# Patient Record
Sex: Male | Born: 1975 | Race: Black or African American | Hispanic: No | Marital: Single | Smoking: Current every day smoker
Health system: Southern US, Community
[De-identification: ages and names within clinical notes are randomized; demographics above are authoritative.]

---

## 2004-12-05 ENCOUNTER — Emergency Department: Payer: Self-pay | Admitting: Emergency Medicine

## 2016-11-04 ENCOUNTER — Emergency Department
Admission: EM | Admit: 2016-11-04 | Discharge: 2016-11-04 | Disposition: A | Discharge status: Home / Self Care | Payer: No Typology Code available for payment source | Attending: Emergency Medicine | Admitting: Emergency Medicine

## 2016-11-04 ENCOUNTER — Emergency Department: Payer: No Typology Code available for payment source

## 2016-11-04 ENCOUNTER — Encounter: Payer: Self-pay | Admitting: Emergency Medicine

## 2016-11-04 DIAGNOSIS — M7918 Myalgia, other site: Secondary | ICD-10-CM

## 2016-11-04 DIAGNOSIS — S161XXA Strain of muscle, fascia and tendon at neck level, initial encounter: Secondary | ICD-10-CM | POA: Diagnosis not present

## 2016-11-04 DIAGNOSIS — Y92410 Unspecified street and highway as the place of occurrence of the external cause: Secondary | ICD-10-CM | POA: Insufficient documentation

## 2016-11-04 DIAGNOSIS — Y999 Unspecified external cause status: Secondary | ICD-10-CM | POA: Diagnosis not present

## 2016-11-04 DIAGNOSIS — S0502XA Injury of conjunctiva and corneal abrasion without foreign body, left eye, initial encounter: Secondary | ICD-10-CM | POA: Diagnosis not present

## 2016-11-04 DIAGNOSIS — F17290 Nicotine dependence, other tobacco product, uncomplicated: Secondary | ICD-10-CM | POA: Diagnosis not present

## 2016-11-04 DIAGNOSIS — S199XXA Unspecified injury of neck, initial encounter: Secondary | ICD-10-CM | POA: Diagnosis present

## 2016-11-04 DIAGNOSIS — Y9389 Activity, other specified: Secondary | ICD-10-CM | POA: Diagnosis not present

## 2016-11-04 MED ORDER — OXYCODONE-ACETAMINOPHEN 5-325 MG PO TABS
2.0000 | ORAL_TABLET | Freq: Once | ORAL | Status: AC
  Administered 2016-11-04: 2 via ORAL
  Filled 2016-11-04: qty 2

## 2016-11-04 MED ORDER — DIAZEPAM 5 MG PO TABS: 5.0000 mg | ORAL_TABLET | Freq: Three times a day (TID) | ORAL | 0 refills | Status: AC | PRN

## 2016-11-04 MED ORDER — IBUPROFEN 800 MG PO TABS: 800.0000 mg | ORAL_TABLET | Freq: Three times a day (TID) | ORAL | 0 refills | Status: AC | PRN

## 2016-11-04 MED ORDER — TETRACAINE HCL 0.5 % OP SOLN
2.0000 [drp] | Freq: Once | OPHTHALMIC | Status: AC
  Administered 2016-11-04: 2 [drp] via OPHTHALMIC
  Filled 2016-11-04: qty 2

## 2016-11-04 MED ORDER — FLUORESCEIN SODIUM 0.6 MG OP STRP
1.0000 | ORAL_STRIP | Freq: Once | OPHTHALMIC | Status: AC
  Administered 2016-11-04: 1 via OPHTHALMIC
  Filled 2016-11-04: qty 1
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
## 2018-01-23 IMAGING — CR DG KNEE COMPLETE 4+V*R*
4 series · 4 of 4 positions shown · non-contrast
Comparison: None.

CLINICAL DATA: Pain after trauma

EXAM:
RIGHT KNEE - COMPLETE 4+ VIEW

[knee ap]
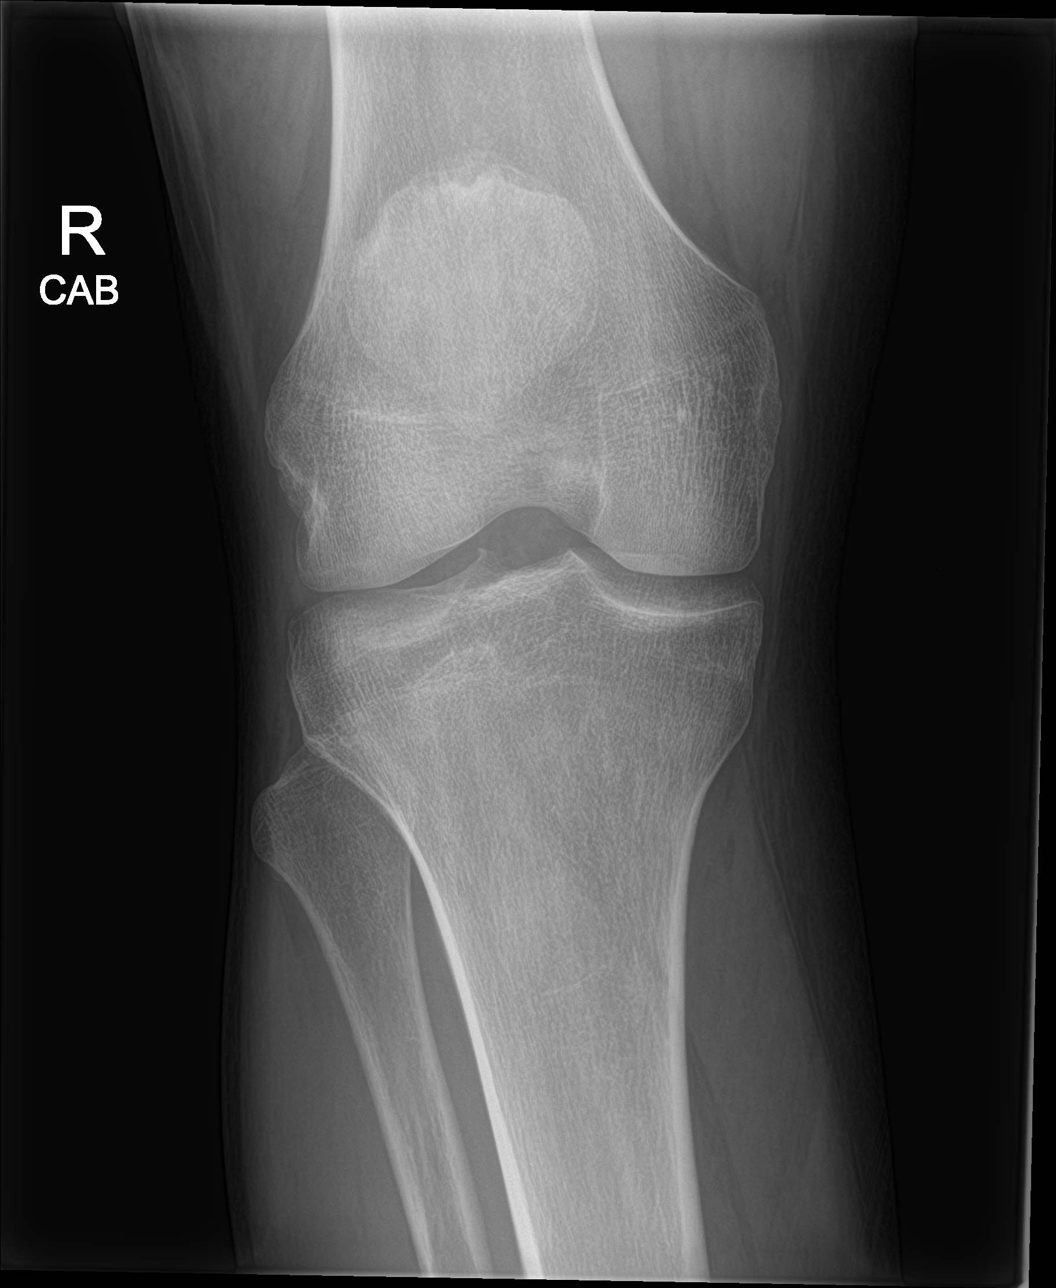

[knee obl (1 of 2)]
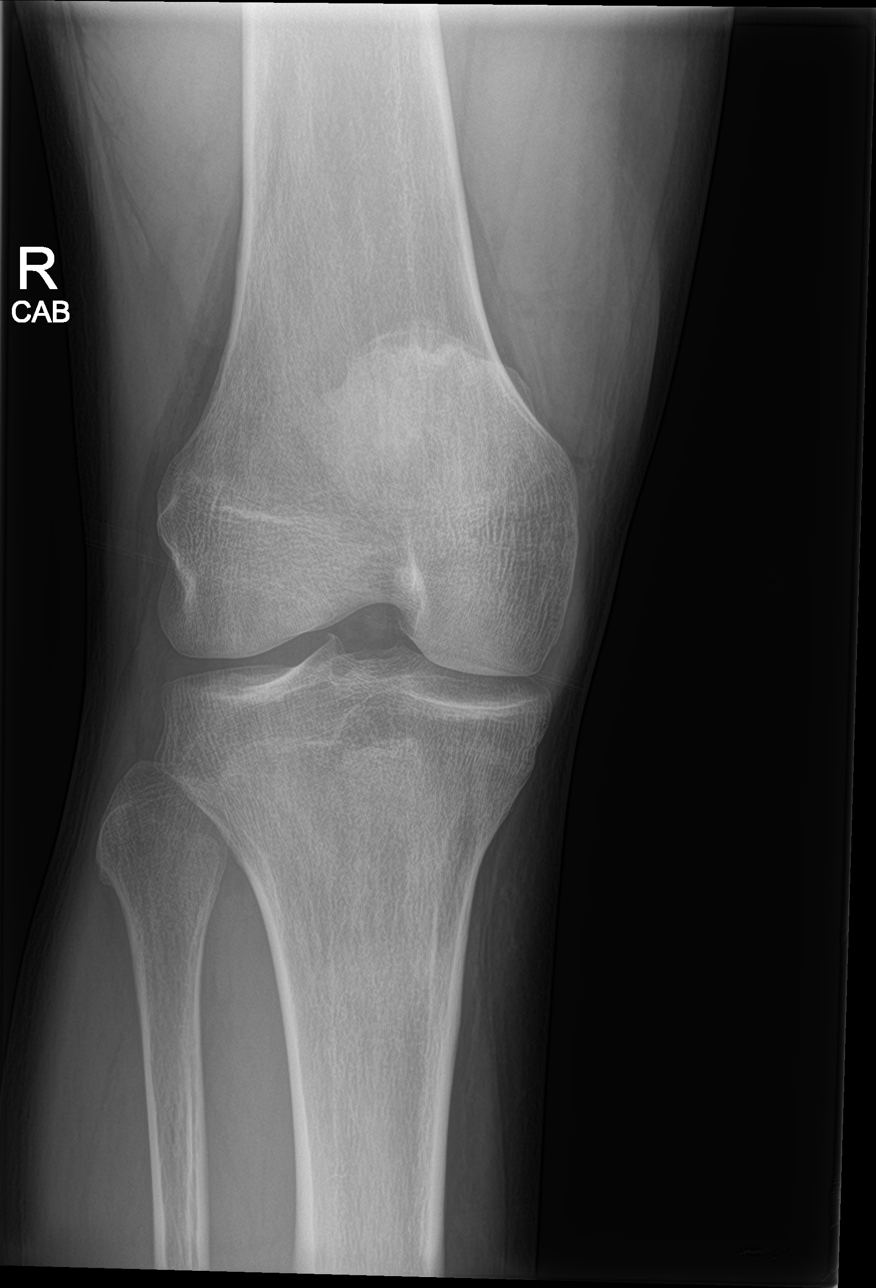

[knee obl (2 of 2)]
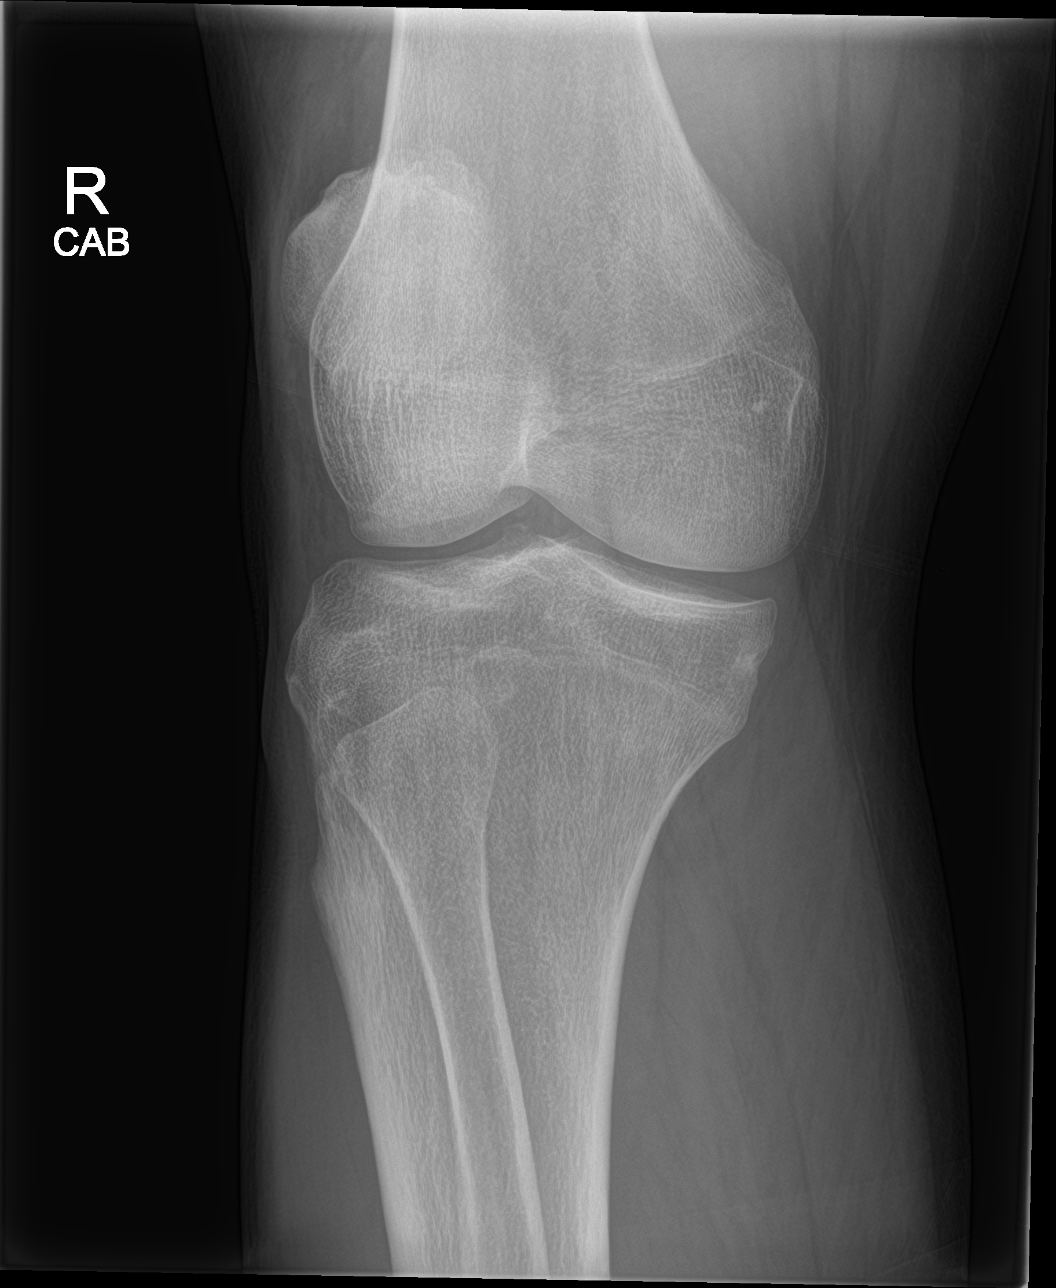

[knee lat]
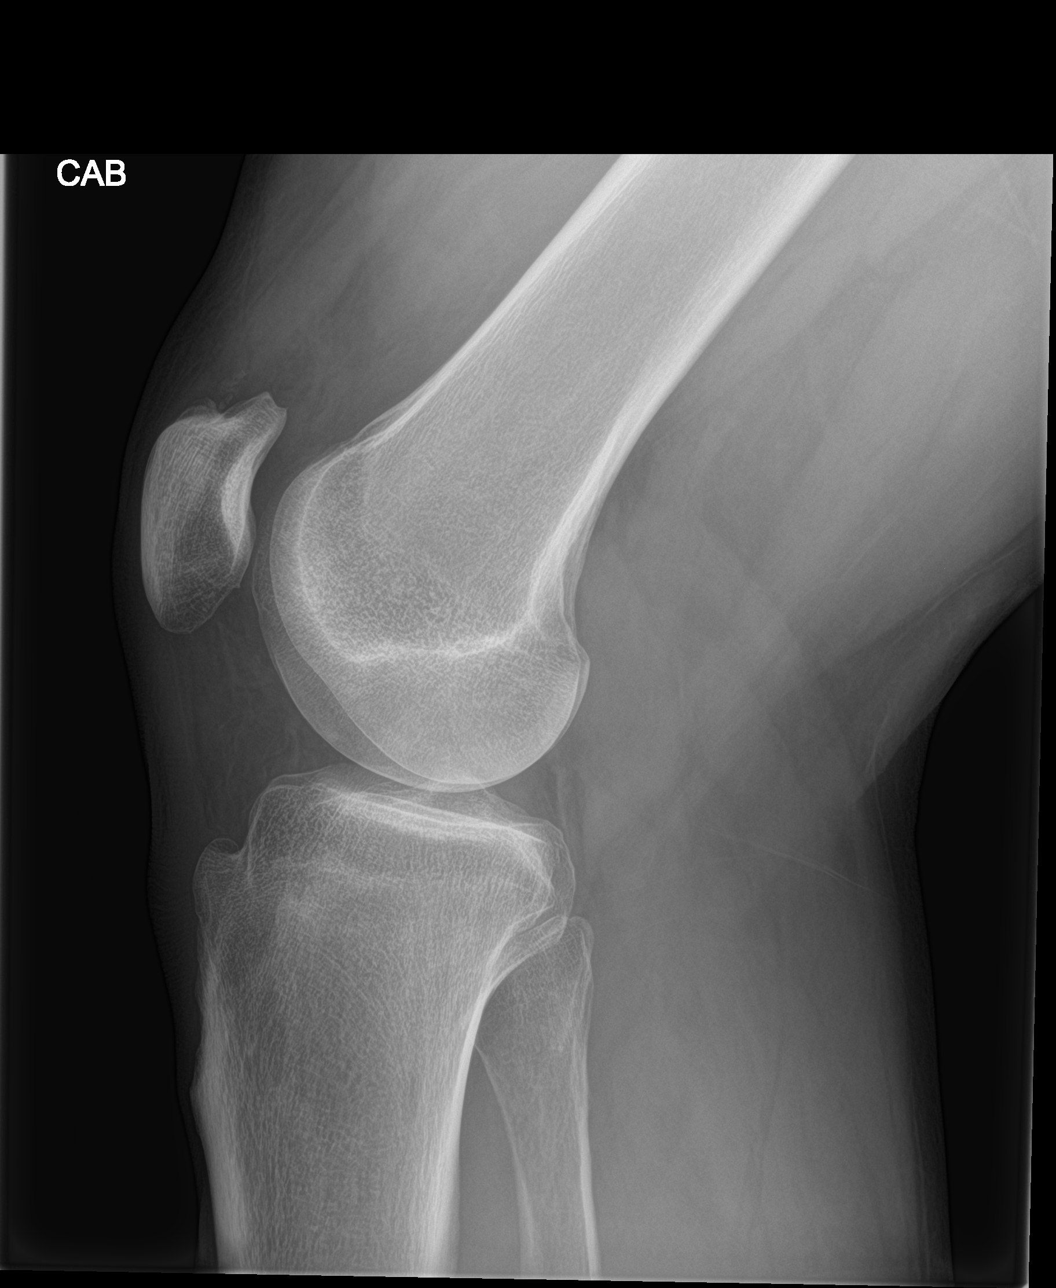

[4 of 4 positions shown; findings below may reference images not displayed]

FINDINGS: The visualized femur, tibia, and fibula demonstrate no evidence of
fracture. Mild irregularity along the superior aspect of the patella
with adjacent soft tissue calcifications are age indeterminate. The
patella however is in good position which would argue against a
significant tendinous injury. No significant joint effusion.
IMPRESSION: Mild irregularity of the superior patella with adjacent soft tissue
calcifications. This is age indeterminate but may not be acute.
Recommend clinical correlation for pain in this region. No other
abnormalities.

## 2018-01-23 IMAGING — CR DG THORACIC SPINE 2V
4 series · 4 of 4 positions shown · non-contrast
Comparison: None.

CLINICAL DATA: Pain after trauma

EXAM:
THORACIC SPINE 2 VIEWS

[t-spine ap (1 of 2)]
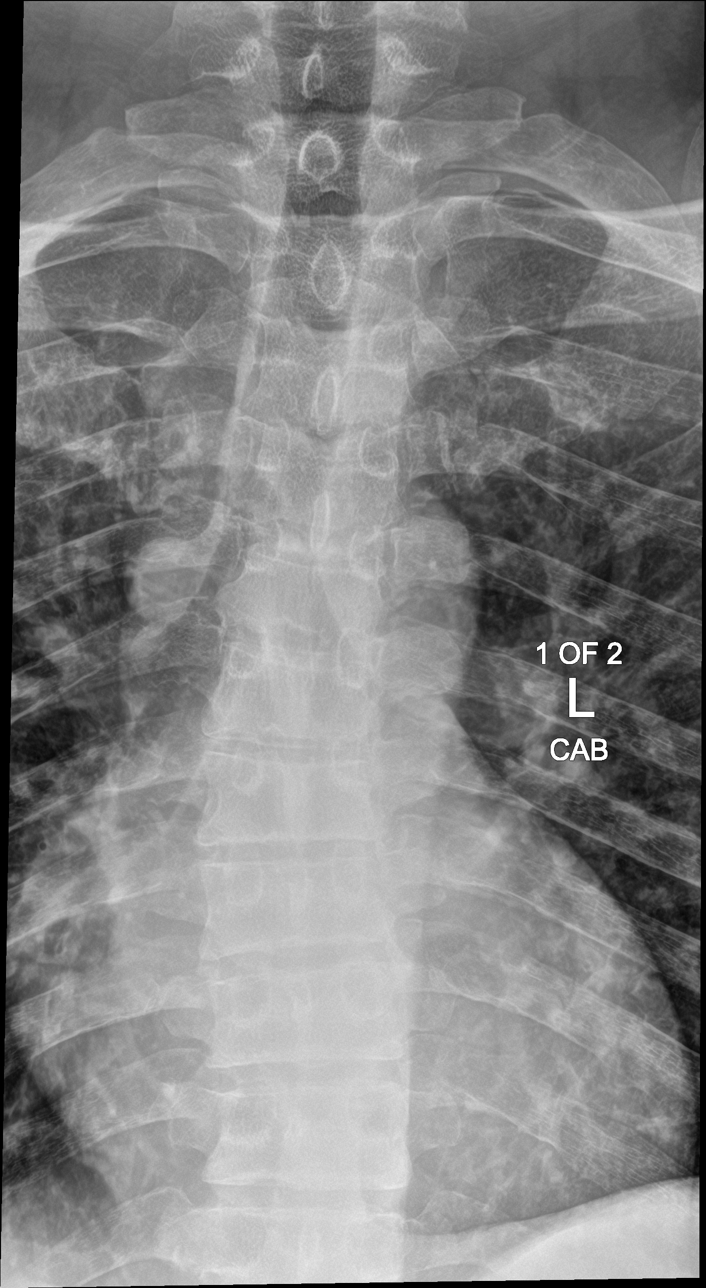

[t-spine lat]
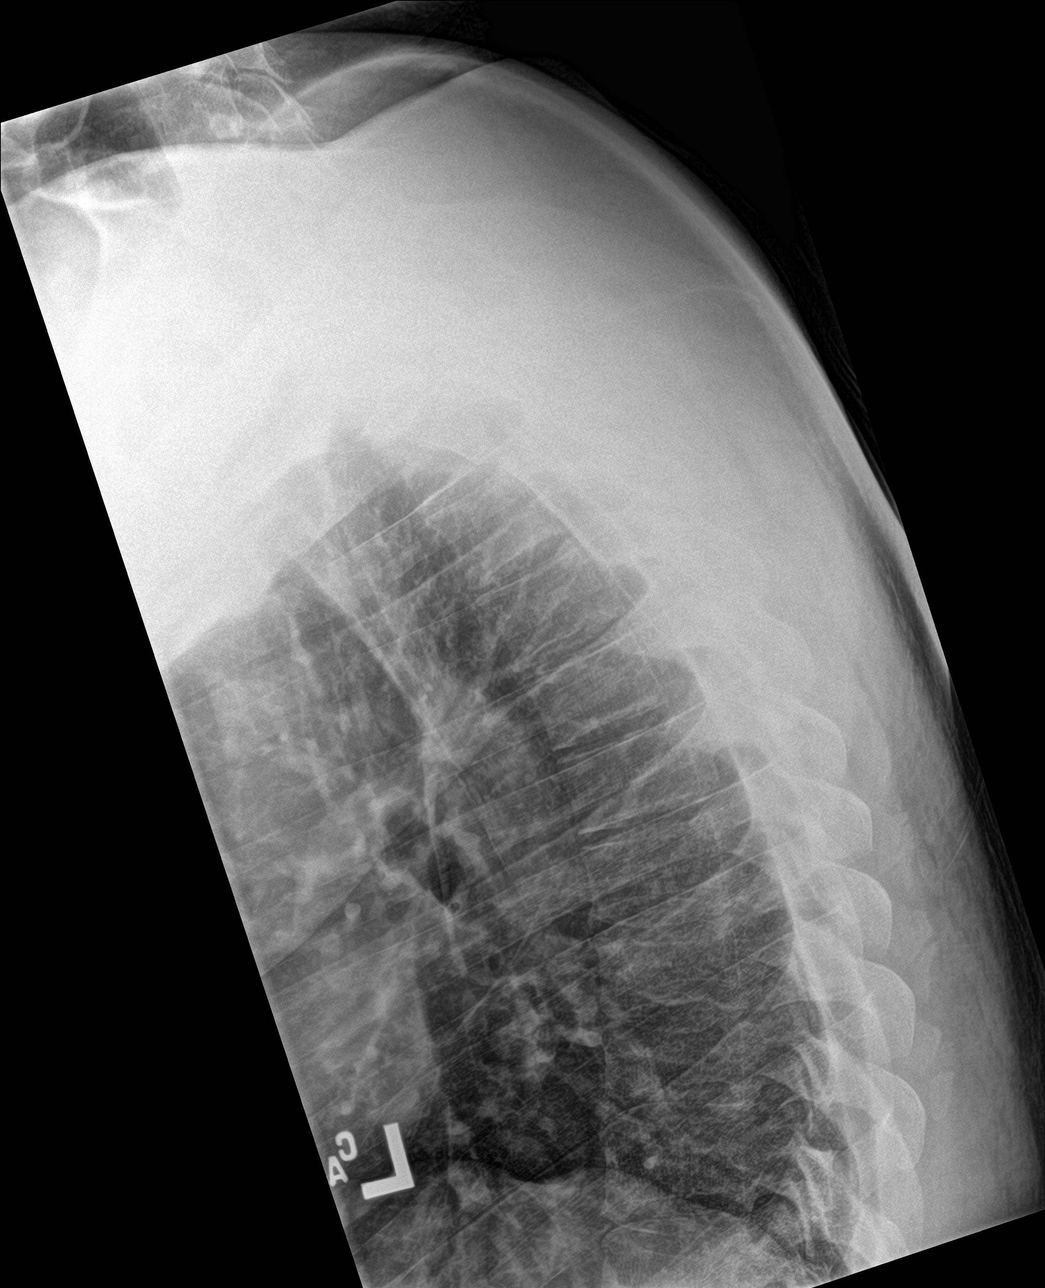

[t-spine swimmers]
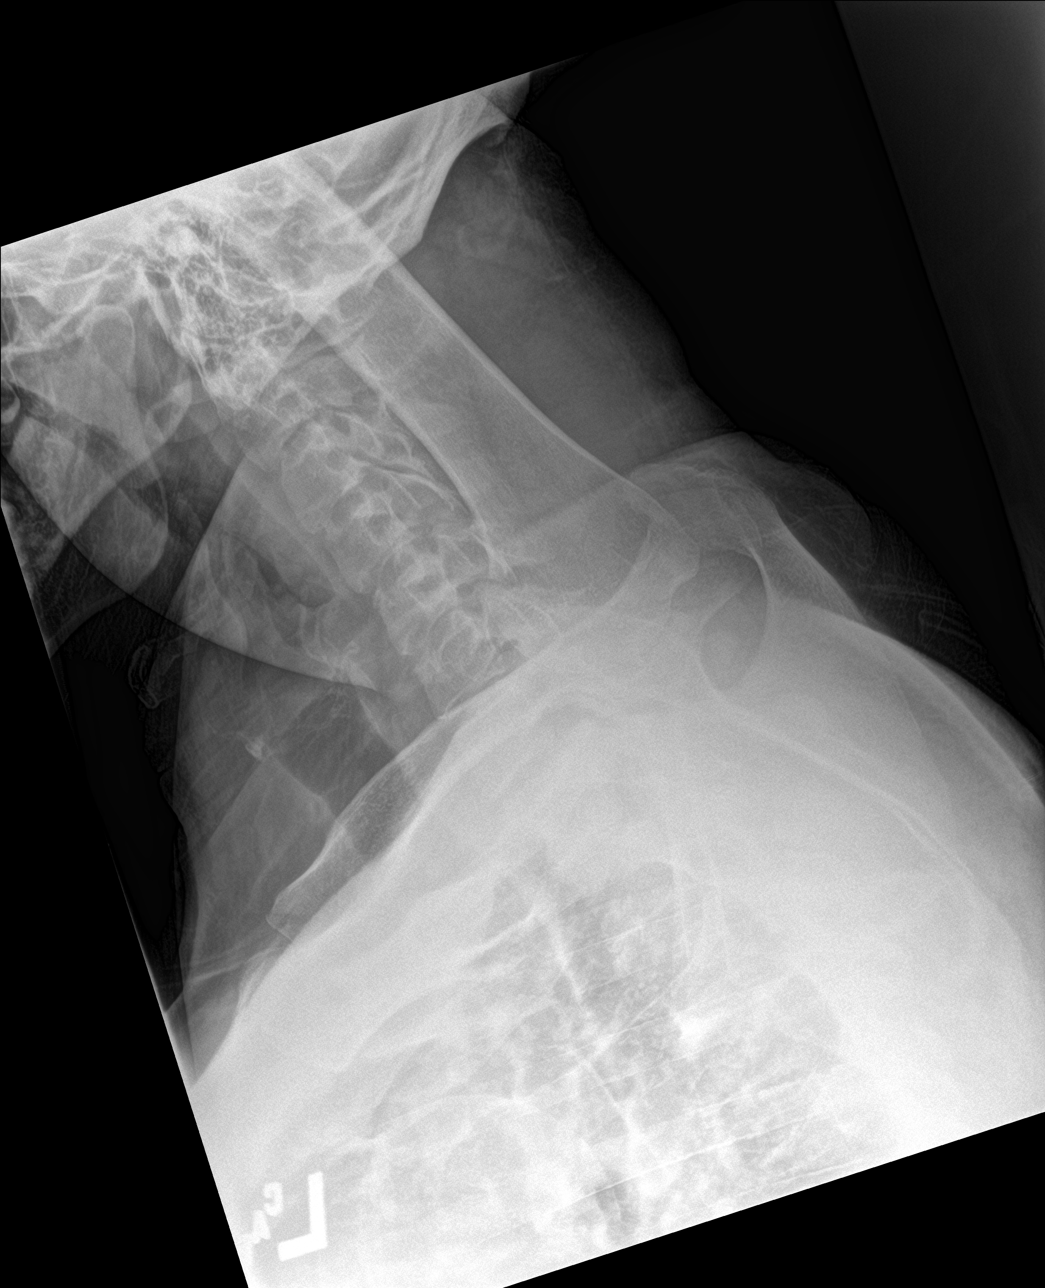

[t-spine ap (2 of 2)]
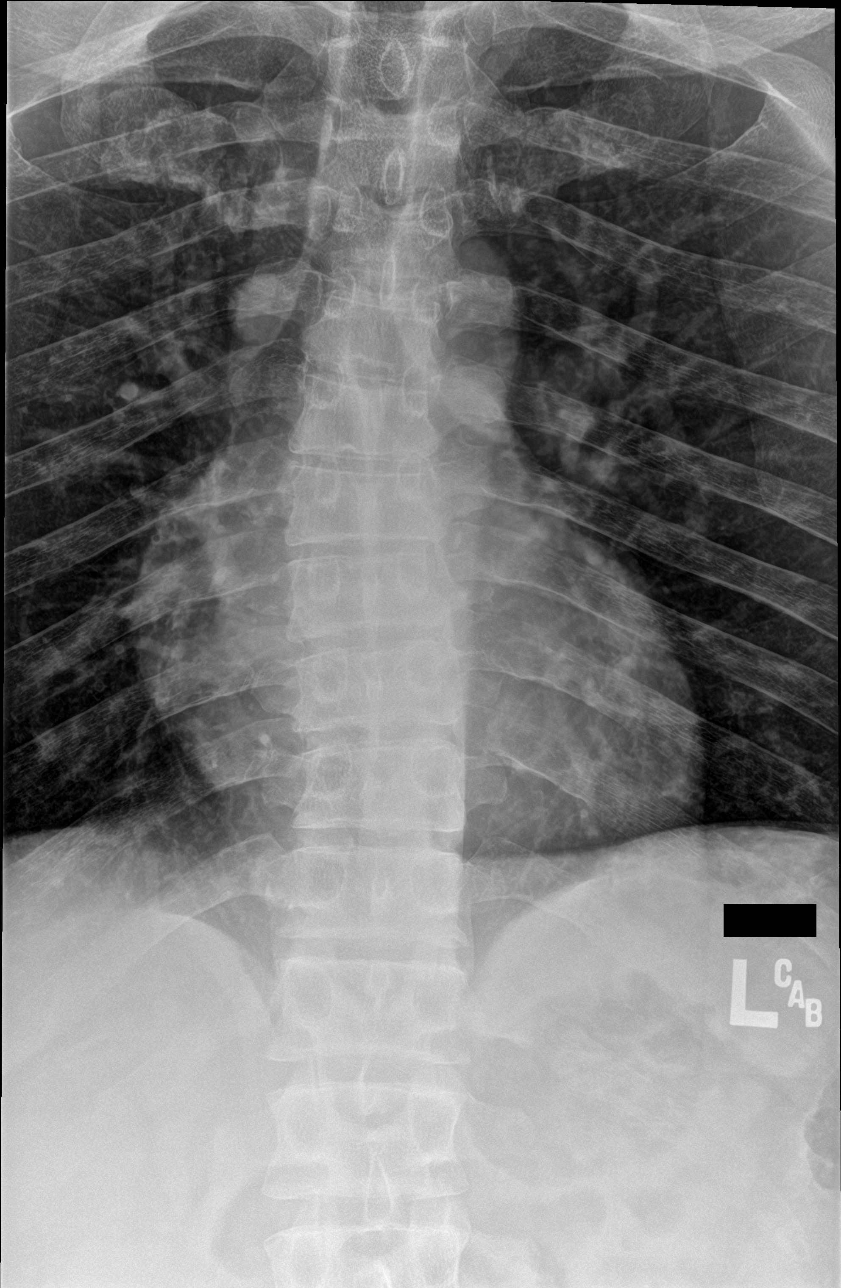

[4 of 4 positions shown; findings below may reference images not displayed]

FINDINGS: Scoliotic curvature of the thoracic spine, apex to the right. No
fracture or malalignment.
IMPRESSION: Negative.

## 2024-05-21 ENCOUNTER — Other Ambulatory Visit: Payer: Self-pay

## 2024-05-21 ENCOUNTER — Emergency Department: Payer: Self-pay

## 2024-05-21 ENCOUNTER — Emergency Department
Admission: EM | Admit: 2024-05-21 | Discharge: 2024-05-21 | Disposition: A | Discharge status: Home / Self Care | Payer: Self-pay | Attending: Emergency Medicine | Admitting: Emergency Medicine

## 2024-05-21 DIAGNOSIS — R000 Tachycardia, unspecified: Secondary | ICD-10-CM | POA: Insufficient documentation

## 2024-05-21 DIAGNOSIS — D72829 Elevated white blood cell count, unspecified: Secondary | ICD-10-CM | POA: Insufficient documentation

## 2024-05-21 DIAGNOSIS — L03116 Cellulitis of left lower limb: Secondary | ICD-10-CM | POA: Insufficient documentation

## 2024-05-21 LAB — LACTIC ACID, PLASMA: Lactic Acid, Venous: 1.1 mmol/L (ref 0.5–1.9)

## 2024-05-21 LAB — COMPREHENSIVE METABOLIC PANEL WITH GFR
ALT: 17 U/L (ref 0–44)
AST: 20 U/L (ref 15–41)
Albumin: 4 g/dL (ref 3.5–5.0)
Alkaline Phosphatase: 47 U/L (ref 38–126)
Anion gap: 9 (ref 5–15)
BUN: 13 mg/dL (ref 6–20)
CO2: 23 mmol/L (ref 22–32)
Calcium: 9.1 mg/dL (ref 8.9–10.3)
Chloride: 104 mmol/L (ref 98–111)
Creatinine, Ser: 1.11 mg/dL (ref 0.61–1.24)
GFR, Estimated: >60 mL/min (ref >60)
Glucose, Bld: 117 mg/dL — ABNORMAL HIGH (ref 70–99)
Potassium: 3.8 mmol/L (ref 3.5–5.1)
Sodium: 136 mmol/L (ref 135–145)
Total Bilirubin: 0.7 mg/dL (ref 0.0–1.2)
Total Protein: 6.9 g/dL (ref 6.5–8.1)

## 2024-05-21 LAB — CBC
HCT: 43.5 % (ref 39.0–52.0)
Hemoglobin: 14.5 g/dL (ref 13.0–17.0)
MCH: 31.3 pg (ref 26.0–34.0)
MCHC: 33.3 g/dL (ref 30.0–36.0)
MCV: 94 fL (ref 80.0–100.0)
Platelets: 153 10*3/uL (ref 150–400)
RBC: 4.63 MIL/uL (ref 4.22–5.81)
RDW: 13 % (ref 11.5–15.5)
WBC: 13 10*3/uL — ABNORMAL HIGH (ref 4.0–10.5)
nRBC: 0 % (ref 0.0–0.2)

## 2024-05-21 MED ORDER — ACETAMINOPHEN 325 MG PO TABS
650.0000 mg | ORAL_TABLET | Freq: Once | ORAL | Status: AC | PRN
  Administered 2024-05-21: 650 mg via ORAL
  Filled 2024-05-21: qty 2

## 2024-05-21 MED ORDER — MORPHINE SULFATE (PF) 4 MG/ML IV SOLN
4.0000 mg | Freq: Once | INTRAVENOUS | Status: DC
  Filled 2024-05-21: qty 1

## 2024-05-21 MED ORDER — CEFTRIAXONE SODIUM 2 G IJ SOLR
2.0000 g | Freq: Once | INTRAMUSCULAR | Status: AC
  Administered 2024-05-21: 2 g via INTRAVENOUS
  Filled 2024-05-21: qty 20

## 2024-05-21 MED ORDER — SODIUM CHLORIDE 0.9 % IV BOLUS
1000.0000 mL | Freq: Once | INTRAVENOUS | Status: AC
  Administered 2024-05-21: 1000 mL via INTRAVENOUS

## 2024-05-21 MED ORDER — CEPHALEXIN 500 MG PO CAPS: 500.0000 mg | ORAL_CAPSULE | Freq: Two times a day (BID) | ORAL | 0 refills | Status: AC

## 2024-05-21 NOTE — ED Provider Notes (Signed)
 [facility] Provider Note    Event Date/Time   First MD Initiated Contact with Patient 05/21/24 2000     (approximate)   History   Leg Swelling   HPI  Daniel Patterson is a 48 y.o. male presents to the emergency department with left leg swelling.  Started noting swelling and redness yesterday.  Fever yesterday.  States that he had worsening pain today.  History of cellulitis in that leg before.  No history of DVT or PE.  Denies any chest pain or shortness of breath.  Denies nausea or vomiting.  States that he is just been very hot and cold and feeling very tired.  No recent antibiotic use.  No recent travel.  No known tick bites or no exposure.     Physical Exam   Triage Vital Signs: ED Triage Vitals  Encounter Vitals Group     BP 05/21/24 1827 (!) 164/79     Girls Systolic BP Percentile --      Girls Diastolic BP Percentile --      Boys Systolic BP Percentile --      Boys Diastolic BP Percentile --      Pulse Rate 05/21/24 1827 (!) 123     Resp 05/21/24 1827 19     Temp 05/21/24 1827 (!) 100.9 F (38.3 C)     Temp src --      SpO2 05/21/24 1827 98 %     Weight 05/21/24 1828 285 lb (129.3 kg)     Height 05/21/24 1828 6' 6 (1.981 m)     Head Circumference --      Peak Flow --      Pain Score 05/21/24 1827 10     Pain Loc --      Pain Education --      Exclude from Growth Chart --     Most recent vital signs: Vitals:   05/21/24 1827 05/21/24 2028  BP: (!) 164/79 122/81  Pulse: (!) 123 (!) 106  Resp: 19 19  Temp: (!) 100.9 F (38.3 C) 98.7 F (37.1 C)  SpO2: 98% 100%    Physical Exam Constitutional:      Appearance: He is well-developed.  HENT:     Head: Atraumatic.   Eyes:     Conjunctiva/sclera: Conjunctivae normal.    Cardiovascular:     Rate and Rhythm: Regular rhythm. Tachycardia present.  Pulmonary:     Effort: No respiratory distress.   Musculoskeletal:     Cervical back: Normal range of motion.     Right lower  leg: No edema.     Left lower leg: Edema present.     Comments: Left lower extremity with erythema warmth.  No induration or crepitance.  Enlarged when compared to the right lower extremity.  Ankle monitor on the right lower extremity.   Skin:    General: Skin is warm.     Capillary Refill: Capillary refill takes less than 2 seconds.   Neurological:     Mental Status: He is alert. Mental status is at baseline.   Psychiatric:        Mood and Affect: Mood normal.     IMPRESSION / MDM / ASSESSMENT AND PLAN / ED COURSE  I reviewed the triage vital signs and the nursing notes.  Differential diagnosis including cellulitis, DVT.  Have low suspicion for necrotizing soft tissue infection.  Low suspicion for an abscess.  Good peripheral pulses.  Neurovascularly intact.    Sinus  tachycardia  while on cardiac telemetry.  RADIOLOGY I independently reviewed imaging, my interpretation of imaging: Ultrasound DVT of the left  LABS (all labs ordered are listed, but only abnormal results are displayed) Labs interpreted as -    Labs Reviewed  CBC - Abnormal; Notable for the following components:      Result Value   WBC 13.0 (*)    All other components within normal limits  COMPREHENSIVE METABOLIC PANEL WITH GFR - Abnormal; Notable for the following components:   Glucose, Bld 117 (*)    All other components within normal limits  LACTIC ACID, PLASMA     MDM    On arrival tachycardic and febrile.  Leukocytosis of 13.  Given Tylenol  and fever improved.  Started on IV Rocephin for cellulitis.  Ultrasound with no signs of DVT.  On reevaluation patient states he is feeling much better.  No longer with fever.  Tolerating p.o.  Will discharge the patient with prescription for antibiotics with Keflex since this has worked well for him in the past.  Discussed return precautions for any worsening symptoms or if his symptoms do not improve.  Given information to follow-up and referral for primary  care.  No questions at time of discharge.   PROCEDURES:  Critical Care performed: No  Procedures  Patient's presentation is most consistent with acute presentation with potential threat to life or bodily function.   MEDICATIONS ORDERED IN ED: Medications  morphine (PF) 4 MG/ML injection 4 mg (4 mg Intravenous Not Given 05/21/24 2046)  acetaminophen  (TYLENOL ) tablet 650 mg (650 mg Oral Given 05/21/24 1832)  sodium chloride 0.9 % bolus 1,000 mL (1,000 mLs Intravenous New Bag/Given 05/21/24 2046)  cefTRIAXone (ROCEPHIN) 2 g in sodium chloride 0.9 % 100 mL IVPB (2 g Intravenous New Bag/Given 05/21/24 2046)    FINAL CLINICAL IMPRESSION(S) / ED DIAGNOSES   Final diagnoses:  Cellulitis of left lower extremity     Rx / DC Orders   ED Discharge Orders          Ordered    Ambulatory Referral to Primary Care (Establish Care)        05/21/24 2037    cephALEXin (KEFLEX) 500 MG capsule  2 times daily        05/21/24 2219             Note:  This document was prepared using Dragon voice recognition software and may include unintentional dictation errors.   Suzanne Kirsch, MD 05/21/24 2220

## 2024-05-21 NOTE — ED Triage Notes (Signed)
 Pt comes in via pov with complaints of leg leg pain that started yesterday. Pt complains of pain 10/10 on the left leg. Pt with swelling to the left lower leg. Pt has a history of cellulitis to the same area. Pt with no signs of acute distress at this time.

## 2024-05-21 NOTE — ED Notes (Signed)
 Pt has swelling and redness to left lower leg.  No known injury .   Sx began yesterday.      Hx cellulitis.  Pt alert

## 2024-06-26 ENCOUNTER — Emergency Department: Payer: Self-pay

## 2024-06-26 ENCOUNTER — Emergency Department
Admission: EM | Admit: 2024-06-26 | Discharge: 2024-06-26 | Disposition: A | Discharge status: Home / Self Care | Payer: Self-pay | Attending: Emergency Medicine | Admitting: Emergency Medicine

## 2024-06-26 ENCOUNTER — Other Ambulatory Visit: Payer: Self-pay

## 2024-06-26 ENCOUNTER — Encounter: Payer: Self-pay | Admitting: Emergency Medicine

## 2024-06-26 DIAGNOSIS — L03115 Cellulitis of right lower limb: Secondary | ICD-10-CM | POA: Insufficient documentation

## 2024-06-26 LAB — CBC WITH DIFFERENTIAL/PLATELET
Abs Immature Granulocytes: 0.02 K/uL (ref 0.00–0.07)
Basophils Absolute: 0 K/uL (ref 0.0–0.1)
Basophils Relative: 0 %
Eosinophils Absolute: 0.1 K/uL (ref 0.0–0.5)
Eosinophils Relative: 1 %
HCT: 40.2 % (ref 39.0–52.0)
Hemoglobin: 13.5 g/dL (ref 13.0–17.0)
Immature Granulocytes: 0 %
Lymphocytes Relative: 27 %
Lymphs Abs: 2.5 K/uL (ref 0.7–4.0)
MCH: 32.1 pg (ref 26.0–34.0)
MCHC: 33.6 g/dL (ref 30.0–36.0)
MCV: 95.5 fL (ref 80.0–100.0)
Monocytes Absolute: 0.7 K/uL (ref 0.1–1.0)
Monocytes Relative: 8 %
Neutro Abs: 5.7 K/uL (ref 1.7–7.7)
Neutrophils Relative %: 64 %
Platelets: 184 K/uL (ref 150–400)
RBC: 4.21 MIL/uL — ABNORMAL LOW (ref 4.22–5.81)
RDW: 12.6 % (ref 11.5–15.5)
WBC: 9 K/uL (ref 4.0–10.5)
nRBC: 0 % (ref 0.0–0.2)

## 2024-06-26 LAB — BASIC METABOLIC PANEL WITH GFR
Anion gap: 9 (ref 5–15)
BUN: 11 mg/dL (ref 6–20)
CO2: 27 mmol/L (ref 22–32)
Calcium: 9 mg/dL (ref 8.9–10.3)
Chloride: 104 mmol/L (ref 98–111)
Creatinine, Ser: 1.12 mg/dL (ref 0.61–1.24)
GFR, Estimated: >60 mL/min (ref >60)
Glucose, Bld: 112 mg/dL — ABNORMAL HIGH (ref 70–99)
Potassium: 3.3 mmol/L — ABNORMAL LOW (ref 3.5–5.1)
Sodium: 140 mmol/L (ref 135–145)

## 2024-06-26 MED ORDER — CEPHALEXIN 500 MG PO CAPS: 500.0000 mg | ORAL_CAPSULE | Freq: Four times a day (QID) | ORAL | 0 refills | Status: AC
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
## 2024-06-26 NOTE — Discharge Instructions (Addendum)
 Your evaluated in the ED for right leg pain and swelling.  Your presentation is clinically consistent with cellulitis.  Your lab work is reassuring.  Your ultrasound was normal.  Please take prescribed antibiotics as instructed.  Follow-up with your primary care provider for resolution of symptoms.  A referral has been sent for you.  A list has also been provided for you below.  Please go to the following website to schedule new (and existing) patient appointments:   [email or web address]   The following is a list of primary care offices in the area who are accepting new patients at this time.  Please reach out to one of them directly and let them know you would like to schedule an appointment to follow up on an Emergency Department visit, and/or to establish a new primary care provider (PCP).  There are likely other primary care clinics in the are who are accepting new patients, but this is an excellent place to start:  [facility] Lead physician: Dr Jon Eva [address] [place], [place] [address] [phone]  [facility] Lead Physician: Dr Dorette Loron [address], [place], [place] [address] [phone]  [facility]  Lead Physician: Dr Duwaine Louder [address] [place], [place] [address] [phone]  [facility] Lead Physician: Dr Marolyn Officer [address] [place], [place] [address] [phone]  [facility] Lead Physician: Dr Leita Adie [address] [place], [place], [place] [address] [phone]
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
## 2024-07-07 ENCOUNTER — Other Ambulatory Visit: Payer: Self-pay

## 2024-07-07 ENCOUNTER — Emergency Department
Admission: EM | Admit: 2024-07-07 | Discharge: 2024-07-07 | Disposition: A | Discharge status: Home / Self Care | Attending: Emergency Medicine | Admitting: Emergency Medicine

## 2024-07-07 DIAGNOSIS — R202 Paresthesia of skin: Secondary | ICD-10-CM | POA: Insufficient documentation

## 2024-07-07 DIAGNOSIS — Y92410 Unspecified street and highway as the place of occurrence of the external cause: Secondary | ICD-10-CM | POA: Insufficient documentation

## 2024-07-07 DIAGNOSIS — M7918 Myalgia, other site: Secondary | ICD-10-CM

## 2024-07-07 DIAGNOSIS — M545 Low back pain, unspecified: Secondary | ICD-10-CM | POA: Diagnosis present

## 2024-07-07 DIAGNOSIS — S39012A Strain of muscle, fascia and tendon of lower back, initial encounter: Secondary | ICD-10-CM | POA: Insufficient documentation

## 2024-07-07 MED ORDER — CYCLOBENZAPRINE HCL 10 MG PO TABS
10.0000 mg | ORAL_TABLET | Freq: Once | ORAL | Status: AC
  Administered 2024-07-07: 10 mg via ORAL
  Filled 2024-07-07: qty 1

## 2024-07-07 MED ORDER — LIDOCAINE 5 % EX PTCH: 1.0000 | MEDICATED_PATCH | Freq: Two times a day (BID) | CUTANEOUS | 0 refills | Status: AC | PRN

## 2024-07-07 MED ORDER — CYCLOBENZAPRINE HCL 5 MG PO TABS: 5.0000 mg | ORAL_TABLET | Freq: Three times a day (TID) | ORAL | 0 refills | Status: AC | PRN

## 2024-07-07 MED ORDER — NAPROXEN 500 MG PO TABS: 500.0000 mg | ORAL_TABLET | Freq: Two times a day (BID) | ORAL | 0 refills | Status: AC

## 2024-07-07 MED ORDER — NAPROXEN 500 MG PO TABS
500.0000 mg | ORAL_TABLET | Freq: Once | ORAL | Status: AC
  Administered 2024-07-07: 500 mg via ORAL
  Filled 2024-07-07: qty 1

## 2024-07-07 MED ORDER — LIDOCAINE 5 % EX PTCH
1.0000 | MEDICATED_PATCH | Freq: Once | CUTANEOUS | Status: DC
  Administered 2024-07-07: 1 via TRANSDERMAL
  Filled 2024-07-07: qty 1
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
